# Patient Record
Sex: Male | Born: 1940 | Race: White | Hispanic: No | Marital: Married | Smoking: Former smoker
Health system: Southern US, Community
[De-identification: ages and names within clinical notes are randomized; demographics above are authoritative.]

## PROBLEM LIST (undated history)

## (undated) DIAGNOSIS — K222 Esophageal obstruction: Secondary | ICD-10-CM

## (undated) DIAGNOSIS — R1319 Other dysphagia: Secondary | ICD-10-CM

## (undated) DIAGNOSIS — G4700 Insomnia, unspecified: Secondary | ICD-10-CM

## (undated) DIAGNOSIS — K219 Gastro-esophageal reflux disease without esophagitis: Secondary | ICD-10-CM

## (undated) DIAGNOSIS — N400 Enlarged prostate without lower urinary tract symptoms: Secondary | ICD-10-CM

## (undated) HISTORY — PX: UPPER GI ENDOSCOPY: SHX6162

## (undated) HISTORY — PX: HERNIA REPAIR: SHX51

## (undated) HISTORY — PX: COLONOSCOPY: SHX174

---

## 2009-09-19 ENCOUNTER — Ambulatory Visit: Payer: Self-pay | Admitting: Gastroenterology

## 2012-08-15 ENCOUNTER — Ambulatory Visit: Payer: Self-pay | Admitting: Gastroenterology

## 2016-06-26 ENCOUNTER — Other Ambulatory Visit: Payer: Self-pay | Admitting: Internal Medicine

## 2016-06-26 ENCOUNTER — Ambulatory Visit
Admission: RE | Admit: 2016-06-26 | Discharge: 2016-06-26 | Disposition: A | Discharge status: Home / Self Care | Payer: BLUE CROSS/BLUE SHIELD | Source: Ambulatory Visit | Attending: Internal Medicine | Admitting: Internal Medicine

## 2016-06-26 DIAGNOSIS — I82421 Acute embolism and thrombosis of right iliac vein: Secondary | ICD-10-CM | POA: Insufficient documentation

## 2016-10-27 DIAGNOSIS — C4492 Squamous cell carcinoma of skin, unspecified: Secondary | ICD-10-CM

## 2016-10-27 HISTORY — DX: Squamous cell carcinoma of skin, unspecified: C44.92

## 2017-11-12 DIAGNOSIS — Z125 Encounter for screening for malignant neoplasm of prostate: Secondary | ICD-10-CM | POA: Diagnosis not present

## 2017-11-12 DIAGNOSIS — Z79899 Other long term (current) drug therapy: Secondary | ICD-10-CM | POA: Diagnosis not present

## 2017-11-18 DIAGNOSIS — Z125 Encounter for screening for malignant neoplasm of prostate: Secondary | ICD-10-CM | POA: Diagnosis not present

## 2017-11-18 DIAGNOSIS — Z0000 Encounter for general adult medical examination without abnormal findings: Secondary | ICD-10-CM | POA: Diagnosis not present

## 2017-11-18 DIAGNOSIS — Z79899 Other long term (current) drug therapy: Secondary | ICD-10-CM | POA: Diagnosis not present

## 2018-02-22 DIAGNOSIS — L03115 Cellulitis of right lower limb: Secondary | ICD-10-CM | POA: Diagnosis not present

## 2018-03-30 DIAGNOSIS — J0100 Acute maxillary sinusitis, unspecified: Secondary | ICD-10-CM | POA: Diagnosis not present

## 2018-07-07 DIAGNOSIS — H2513 Age-related nuclear cataract, bilateral: Secondary | ICD-10-CM | POA: Diagnosis not present

## 2018-07-26 DIAGNOSIS — R21 Rash and other nonspecific skin eruption: Secondary | ICD-10-CM | POA: Diagnosis not present

## 2018-07-26 DIAGNOSIS — Z85828 Personal history of other malignant neoplasm of skin: Secondary | ICD-10-CM | POA: Diagnosis not present

## 2018-07-26 DIAGNOSIS — D485 Neoplasm of uncertain behavior of skin: Secondary | ICD-10-CM | POA: Diagnosis not present

## 2018-08-02 DIAGNOSIS — L249 Irritant contact dermatitis, unspecified cause: Secondary | ICD-10-CM | POA: Diagnosis not present

## 2018-11-14 ENCOUNTER — Other Ambulatory Visit: Payer: Self-pay | Admitting: Neurology

## 2018-11-14 DIAGNOSIS — R4189 Other symptoms and signs involving cognitive functions and awareness: Secondary | ICD-10-CM

## 2018-11-30 ENCOUNTER — Other Ambulatory Visit: Payer: Self-pay

## 2018-11-30 ENCOUNTER — Ambulatory Visit
Admission: RE | Admit: 2018-11-30 | Discharge: 2018-11-30 | Disposition: A | Discharge status: Home / Self Care | Payer: Medicare Other | Source: Ambulatory Visit | Attending: Neurology | Admitting: Neurology

## 2018-11-30 DIAGNOSIS — R4189 Other symptoms and signs involving cognitive functions and awareness: Secondary | ICD-10-CM

## 2018-12-01 ENCOUNTER — Other Ambulatory Visit: Payer: Medicare Other

## 2018-12-02 ENCOUNTER — Ambulatory Visit: Payer: Medicare Other

## 2019-09-04 ENCOUNTER — Other Ambulatory Visit: Payer: Self-pay | Admitting: Gastroenterology

## 2019-09-04 DIAGNOSIS — R1310 Dysphagia, unspecified: Secondary | ICD-10-CM

## 2019-09-04 DIAGNOSIS — R1319 Other dysphagia: Secondary | ICD-10-CM

## 2019-09-08 ENCOUNTER — Other Ambulatory Visit: Payer: Self-pay

## 2019-09-08 ENCOUNTER — Other Ambulatory Visit: Payer: Self-pay | Admitting: Gastroenterology

## 2019-09-08 ENCOUNTER — Ambulatory Visit
Admission: RE | Admit: 2019-09-08 | Discharge: 2019-09-08 | Disposition: A | Discharge status: Home / Self Care | Payer: Medicare Other | Source: Ambulatory Visit | Attending: Gastroenterology | Admitting: Gastroenterology

## 2019-09-08 DIAGNOSIS — R1310 Dysphagia, unspecified: Secondary | ICD-10-CM | POA: Diagnosis not present

## 2019-09-08 DIAGNOSIS — R1319 Other dysphagia: Secondary | ICD-10-CM

## 2019-12-12 ENCOUNTER — Other Ambulatory Visit: Payer: Self-pay

## 2019-12-12 ENCOUNTER — Other Ambulatory Visit
Admission: RE | Admit: 2019-12-12 | Discharge: 2019-12-12 | Disposition: A | Discharge status: Home / Self Care | Payer: Medicare Other | Source: Ambulatory Visit | Attending: Internal Medicine | Admitting: Internal Medicine

## 2019-12-12 DIAGNOSIS — Z01812 Encounter for preprocedural laboratory examination: Secondary | ICD-10-CM | POA: Insufficient documentation

## 2019-12-12 DIAGNOSIS — Z20822 Contact with and (suspected) exposure to covid-19: Secondary | ICD-10-CM | POA: Diagnosis not present

## 2019-12-12 LAB — SARS CORONAVIRUS 2 (TAT 6-24 HRS): SARS Coronavirus 2: NEGATIVE

## 2019-12-13 ENCOUNTER — Encounter: Payer: Self-pay | Admitting: Internal Medicine

## 2019-12-14 ENCOUNTER — Ambulatory Visit
Admission: RE | Admit: 2019-12-14 | Discharge: 2019-12-14 | Disposition: A | Discharge status: Home / Self Care | Payer: Medicare Other | Attending: Internal Medicine | Admitting: Internal Medicine

## 2019-12-14 ENCOUNTER — Ambulatory Visit: Payer: Medicare Other | Admitting: Certified Registered Nurse Anesthetist

## 2019-12-14 ENCOUNTER — Encounter
Admission: RE | Disposition: A | Discharge status: Home / Self Care | Payer: Self-pay | Source: Home / Self Care | Attending: Internal Medicine

## 2019-12-14 ENCOUNTER — Encounter: Payer: Self-pay | Admitting: Internal Medicine

## 2019-12-14 ENCOUNTER — Other Ambulatory Visit: Payer: Self-pay

## 2019-12-14 DIAGNOSIS — K2100 Gastro-esophageal reflux disease with esophagitis, without bleeding: Secondary | ICD-10-CM | POA: Diagnosis not present

## 2019-12-14 DIAGNOSIS — Z1211 Encounter for screening for malignant neoplasm of colon: Secondary | ICD-10-CM | POA: Diagnosis not present

## 2019-12-14 DIAGNOSIS — K222 Esophageal obstruction: Secondary | ICD-10-CM | POA: Diagnosis not present

## 2019-12-14 DIAGNOSIS — Z79899 Other long term (current) drug therapy: Secondary | ICD-10-CM | POA: Insufficient documentation

## 2019-12-14 DIAGNOSIS — K640 First degree hemorrhoids: Secondary | ICD-10-CM | POA: Diagnosis not present

## 2019-12-14 DIAGNOSIS — D123 Benign neoplasm of transverse colon: Secondary | ICD-10-CM | POA: Diagnosis not present

## 2019-12-14 DIAGNOSIS — R1310 Dysphagia, unspecified: Secondary | ICD-10-CM | POA: Insufficient documentation

## 2019-12-14 DIAGNOSIS — Z87891 Personal history of nicotine dependence: Secondary | ICD-10-CM | POA: Diagnosis not present

## 2019-12-14 DIAGNOSIS — D122 Benign neoplasm of ascending colon: Secondary | ICD-10-CM | POA: Insufficient documentation

## 2019-12-14 HISTORY — PX: ESOPHAGOGASTRODUODENOSCOPY (EGD) WITH PROPOFOL: SHX5813

## 2019-12-14 HISTORY — DX: Insomnia, unspecified: G47.00

## 2019-12-14 HISTORY — DX: Benign prostatic hyperplasia without lower urinary tract symptoms: N40.0

## 2019-12-14 HISTORY — PX: COLONOSCOPY WITH PROPOFOL: SHX5780

## 2019-12-14 HISTORY — DX: Gastro-esophageal reflux disease without esophagitis: K21.9

## 2019-12-14 HISTORY — DX: Esophageal obstruction: K22.2

## 2019-12-14 HISTORY — DX: Other dysphagia: R13.19

## 2019-12-14 SURGERY — ESOPHAGOGASTRODUODENOSCOPY (EGD) WITH PROPOFOL
Anesthesia: General

## 2019-12-14 MED ORDER — SODIUM CHLORIDE 0.9 % IV SOLN: INTRAVENOUS | Status: DC

## 2019-12-14 MED ORDER — PHENYLEPHRINE HCL (PRESSORS) 10 MG/ML IV SOLN
INTRAVENOUS | Status: AC
  Filled 2019-12-14: qty 1

## 2019-12-14 MED ORDER — PROPOFOL 10 MG/ML IV BOLUS
INTRAVENOUS | Status: DC | PRN
  Administered 2019-12-14: 10 mg via INTRAVENOUS
  Administered 2019-12-14: 20 mg via INTRAVENOUS
  Administered 2019-12-14: 70 mg via INTRAVENOUS

## 2019-12-14 MED ORDER — PROPOFOL 500 MG/50ML IV EMUL
INTRAVENOUS | Status: DC | PRN
  Administered 2019-12-14: 160 ug/kg/min via INTRAVENOUS

## 2019-12-14 MED ORDER — GLYCOPYRROLATE 0.2 MG/ML IJ SOLN
INTRAMUSCULAR | Status: AC
  Filled 2019-12-14: qty 1

## 2019-12-14 MED ORDER — PROPOFOL 500 MG/50ML IV EMUL
INTRAVENOUS | Status: AC
  Filled 2019-12-14: qty 250

## 2019-12-14 MED ORDER — LIDOCAINE HCL (CARDIAC) PF 100 MG/5ML IV SOSY
PREFILLED_SYRINGE | INTRAVENOUS | Status: DC | PRN
  Administered 2019-12-14: 100 mg via INTRAVENOUS

## 2019-12-14 MED ORDER — GLYCOPYRROLATE 0.2 MG/ML IJ SOLN
INTRAMUSCULAR | Status: DC | PRN
  Administered 2019-12-14: .2 mg via INTRAVENOUS

## 2019-12-14 MED ORDER — LIDOCAINE HCL (PF) 2 % IJ SOLN
INTRAMUSCULAR | Status: AC
  Filled 2019-12-14: qty 20

## 2019-12-14 NOTE — Op Note (Signed)
[facility] Gastroenterology Patient Name: Arthur Long Procedure Date: 12/14/2019 7:35 AM MRN: [id number] Account #: [id number] Date of Birth: 1940/11/17 Admit Type: Outpatient Age: 79 Room: [facility] ENDO ROOM 2 Gender: Male Note Status: Finalized Procedure:             Colonoscopy Indications:           Screening for colorectal malignant neoplasm Providers:             [name]. Pepper Wyndham MD, MD Medicines:             Propofol per Anesthesia Complications:         No immediate complications. Procedure:             Pre-Anesthesia Assessment:                        - The risks and benefits of the procedure and the                         sedation options and risks were discussed with the                         patient. All questions were answered and informed                         consent was obtained.                        - Patient identification and proposed procedure were                         verified prior to the procedure by the nurse. The                         procedure was verified in the procedure room.                        - ASA Grade Assessment: III - A patient with severe                         systemic disease.                        - After reviewing the risks and benefits, the patient                         was deemed in satisfactory condition to undergo the                         procedure.                        After obtaining informed consent, the colonoscope was                         passed under direct vision. Throughout the procedure,                         the patient's blood pressure, pulse, and oxygen  saturations were monitored continuously. The                         Colonoscope was introduced through the anus and                         advanced to the the cecum, identified by appendiceal                         orifice and ileocecal valve. The colonoscopy was                         performed without  difficulty. The patient tolerated                         the procedure well. The quality of the bowel                         preparation was adequate. The ileocecal valve,                         appendiceal orifice, and rectum were photographed. Findings:      The perianal and digital rectal examinations were normal. Pertinent       negatives include normal sphincter tone and no palpable rectal lesions.      Multiple small and large-mouthed diverticula were found in the sigmoid       colon.      A 12 mm polyp was found in the transverse colon. The polyp was       pedunculated. The polyp was removed with a hot snare. Resection and       retrieval were complete. To prevent bleeding after the polypectomy, two       hemostatic clips were successfully placed (MR conditional). There was no       bleeding at the end of the procedure.      Two semi-pedunculated polyps were found in the ascending colon. The       polyps were 7 to 9 mm in size. These polyps were removed with a hot       snare. Resection and retrieval were complete.      Non-bleeding internal hemorrhoids were found during retroflexion. The       hemorrhoids were Grade I (internal hemorrhoids that do not prolapse).      The exam was otherwise without abnormality. Impression:            - Diverticulosis in the sigmoid colon.                        - One 12 mm polyp in the transverse colon, removed                         with a hot snare. Resected and retrieved. Clips (MR                         conditional) were placed.                        - Two 7 to 9 mm polyps in the ascending colon, removed  with a hot snare. Resected and retrieved.                        - Non-bleeding internal hemorrhoids.                        - The examination was otherwise normal. Recommendation:        - Await pathology results from EGD, also performed                         today.                        - Monitor results to  esophageal dilation                        - Patient has a contact number available for                         emergencies. The signs and symptoms of potential                         delayed complications were discussed with the patient.                         Return to normal activities tomorrow. Written                         discharge instructions were provided to the patient.                        - Resume previous diet.                        - Continue present medications.                        - Repeat colonoscopy is recommended for surveillance.                         The colonoscopy date will be determined after                         pathology results from today's exam become available                         for review.                        - Return to physician assistant in 3 weeks.                        - No aspirin, ibuprofen, naproxen, or other                         non-steroidal anti-inflammatory drugs for 3 weeks                         after polyp removal.                        -  The findings and recommendations were discussed with                         the patient and their spouse. Procedure Code(s):     --- Professional ---                        [phone], Colonoscopy, flexible; with removal of                         tumor(s), polyp(s), or other lesion(s) by snare                         technique Diagnosis Code(s):     --- Professional ---                        K57.30, Diverticulosis of large intestine without                         perforation or abscess without bleeding                        K64.0, First degree hemorrhoids                        Z12.11, Encounter for screening for malignant neoplasm                         of colon                        K63.5, Polyp of colon CPT copyright 2019 American Medical Association. All rights reserved. The codes documented in this report are preliminary and upon coder review may  be revised to meet  current compliance requirements. Efrain Sella MD, MD 12/14/2019 9:09:14 AM This report has been signed electronically. Number of Addenda: 0 Note Initiated On: 12/14/2019 7:35 AM Scope Withdrawal Time: 0 hours 9 minutes 5 seconds  Total Procedure Duration: 0 hours 17 minutes 52 seconds  Estimated Blood Loss:  Estimated blood loss was minimal.      [facility]

## 2019-12-14 NOTE — Anesthesia Postprocedure Evaluation (Signed)
Anesthesia Post Note  Patient: Arthur Long  Procedure(s) Performed: ESOPHAGOGASTRODUODENOSCOPY (EGD) WITH PROPOFOL (N/A ) COLONOSCOPY WITH PROPOFOL (N/A )  Patient location during evaluation: Endoscopy Anesthesia Type: General Level of consciousness: awake and alert Pain management: pain level controlled Vital Signs Assessment: post-procedure vital signs reviewed and stable Respiratory status: spontaneous breathing, nonlabored ventilation, respiratory function stable and patient connected to nasal cannula oxygen Cardiovascular status: blood pressure returned to baseline and stable Postop Assessment: no apparent nausea or vomiting Anesthetic complications: no   No complications documented.   Last Vitals:  Vitals:   12/14/19 0916 12/14/19 0926  BP: 117/74 105/76  Pulse: 94 87  Resp: 19 16  Temp:    SpO2: 97% 99%    Last Pain:  Vitals:   12/14/19 0926  TempSrc:   PainSc: 0-No pain                 Arita Miss

## 2019-12-14 NOTE — Transfer of Care (Signed)
Immediate Anesthesia Transfer of Care Note  Patient: Arthur Long  Procedure(s) Performed: ESOPHAGOGASTRODUODENOSCOPY (EGD) WITH PROPOFOL (N/A ) COLONOSCOPY WITH PROPOFOL (N/A )  Patient Location: PACU  Anesthesia Type:General  Level of Consciousness: drowsy  Airway & Oxygen Therapy: Patient Spontanous Breathing and Patient connected to nasal cannula oxygen  Post-op Assessment: Report given to RN and Post -op Vital signs reviewed and stable  Post vital signs: Reviewed and stable  Last Vitals:  Vitals Value Taken Time  BP 101/65 12/14/19 0906  Temp    Pulse 87 12/14/19 0907  Resp 17 12/14/19 0907  SpO2 96 % 12/14/19 0907  Vitals shown include unvalidated device data.  Last Pain:  Vitals:   12/14/19 0906  TempSrc:   PainSc: 0-No pain         Complications: No complications documented.

## 2019-12-14 NOTE — Anesthesia Preprocedure Evaluation (Signed)
Anesthesia Evaluation  Patient identified by MRN, date of birth, ID band Patient awake    Reviewed: Allergy & Precautions, NPO status , Patient's Chart, lab work & pertinent test results  History of Anesthesia Complications Negative for: history of anesthetic complications  Airway Mallampati: II  TM Distance: >3 FB Neck ROM: Full    Dental no notable dental hx. (+) Teeth Intact   Pulmonary neg pulmonary ROS, neg sleep apnea, neg COPD, Patient abstained from smoking.Not current smoker, former smoker,    Pulmonary exam normal breath sounds clear to auscultation       Cardiovascular Exercise Tolerance: Good METS(-) hypertension(-) CAD and (-) Past MI negative cardio ROS  (-) dysrhythmias  Rhythm:Regular Rate:Normal - Systolic murmurs    Neuro/Psych negative neurological ROS  negative psych ROS   GI/Hepatic GERD  ,(+)     (-) substance abuse  ,   Endo/Other  neg diabetes  Renal/GU negative Renal ROS     Musculoskeletal   Abdominal   Peds  Hematology   Anesthesia Other Findings Past Medical History: No date: BPH (benign prostatic hyperplasia) No date: Esophageal dysphagia No date: Esophageal stricture No date: GERD (gastroesophageal reflux disease) No date: Insomnia  Reproductive/Obstetrics                            Anesthesia Physical Anesthesia Plan  ASA: II  Anesthesia Plan: General   Post-op Pain Management:    Induction: Intravenous  PONV Risk Score and Plan: 2 and Ondansetron, Propofol infusion and TIVA  Airway Management Planned: Nasal Cannula  Additional Equipment: None  Intra-op Plan:   Post-operative Plan:   Informed Consent: I have reviewed the patients History and Physical, chart, labs and discussed the procedure including the risks, benefits and alternatives for the proposed anesthesia with the patient or authorized representative who has indicated his/her  understanding and acceptance.     Dental advisory given  Plan Discussed with: CRNA and Surgeon  Anesthesia Plan Comments: (Discussed risks of anesthesia with patient, including possibility of difficulty with spontaneous ventilation under anesthesia necessitating airway intervention, PONV, and rare risks such as cardiac or respiratory or neurological events. Patient understands.)        Anesthesia Quick Evaluation

## 2019-12-14 NOTE — Op Note (Signed)
[facility] Gastroenterology Patient Name: Arthur Long Procedure Date: 12/14/2019 7:40 AM MRN: [id number] Account #: [id number] Date of Birth: 07/16/1940 Admit Type: Outpatient Age: 79 Room: [facility] ENDO ROOM 2 Gender: Male Note Status: Finalized Procedure:             Upper GI endoscopy Indications:           Dysphagia, Gastro-esophageal reflux disease Providers:             [name]. [name] MD, MD Referring MD:          Rusty Aus, MD (Referring MD) Medicines:             Propofol per Anesthesia Complications:         No immediate complications. Procedure:             Pre-Anesthesia Assessment:                        - The risks and benefits of the procedure and the                         sedation options and risks were discussed with the                         patient. All questions were answered and informed                         consent was obtained.                        - Patient identification and proposed procedure were                         verified prior to the procedure by the nurse. The                         procedure was verified in the procedure room.                        - ASA Grade Assessment: III - A patient with severe                         systemic disease.                        - After reviewing the risks and benefits, the patient                         was deemed in satisfactory condition to undergo the                         procedure.                        After obtaining informed consent, the endoscope was                         passed under direct vision. Throughout the procedure,                         the patient's blood  pressure, pulse, and oxygen                         saturations were monitored continuously. The Endoscope                         was introduced through the mouth, and advanced to the                         third part of duodenum. The upper GI endoscopy was                         accomplished  without difficulty. The patient tolerated                         the procedure well. Findings:      Diffuse mild mucosal changes characterized by feline appearance were       found in the entire esophagus. Biopsies were obtained from the proximal       and distal esophagus with cold forceps for histology of suspected       eosinophilic esophagitis.      One benign-appearing, intrinsic moderate stenosis was found in the       distal esophagus. This stenosis measured 1.3 cm (inner diameter) x less       than one cm (in length). The stenosis was traversed. The scope was       withdrawn. Dilation was performed with a Maloney dilator with mild       resistance at 46 Fr.      The stomach was normal.      The examined duodenum was normal.      LA Grade C (one or more mucosal breaks continuous between tops of 2 or       more mucosal folds, less than 75% circumference) esophagitis with no       bleeding was found in the distal esophagus. Impression:            - Feline appearance mucosa in the esophagus. Biopsied.                        - Benign-appearing esophageal stenosis. Dilated.                        - Normal stomach.                        - Normal examined duodenum. Recommendation:        - Await pathology results.                        - Proceed with colonoscopy Procedure Code(s):     --- Professional ---                        [phone], Esophagogastroduodenoscopy, flexible,                         transoral; with biopsy, single or multiple                        [id number], Dilation of esophagus, by unguided sound or  bougie, single or multiple passes Diagnosis Code(s):     --- Professional ---                        K21.9, Gastro-esophageal reflux disease without                         esophagitis                        R13.10, Dysphagia, unspecified                        K22.2, Esophageal obstruction CPT copyright 2019 American Medical Association. All rights  reserved. The codes documented in this report are preliminary and upon coder review may  be revised to meet current compliance requirements. Efrain Sella MD, MD 12/14/2019 8:43:19 AM This report has been signed electronically. Number of Addenda: 0 Note Initiated On: 12/14/2019 7:40 AM Estimated Blood Loss:  Estimated blood loss: none. Estimated blood loss: none.      [facility]

## 2019-12-14 NOTE — H&P (Signed)
Outpatient short stay form Pre-procedure 12/14/2019 8:29 AM Arthur Long Arthur Long, M.D.  Primary Physician: Arthur Long, M.D.  Reason for visit:  Dysphagia, GERD, colon cancer screening  History of present illness:  79 y/o male with hx of memory disturbance c/o dysphagia to solids like chicken to the level of the suprasternal notch. No hemetemesis or significant weight loss or abdominal pain. Appetite normal.  Patient presents for colonoscopy for colon cancer screening. The patient denies complaints of abdominal pain, significant change in bowel habits, or rectal bleeding.      Current Facility-Administered Medications:  .  0.9 %  sodium chloride infusion, , Intravenous, Continuous, [place], Arthur Pike, MD, Last Rate: 20 mL/hr at 12/14/19 8469, Continued from Pre-op at 12/14/19 6295  Medications Prior to Admission  Medication Sig Dispense Refill Last Dose  . Alum & Mag Hydroxide-Simeth (MYLANTA PO) Take by mouth.     . pantoprazole (PROTONIX) 40 MG tablet Take 40 mg by mouth daily.   12/13/2019 at Unknown time  . rivastigmine (EXELON) 3 MG capsule Take 3 mg by mouth 2 (two) times daily.     . Saw Palmetto, Serenoa repens, (SAW PALMETTO PO) Take by mouth 2 (two) times daily.     . temazepam (RESTORIL) 30 MG capsule Take 30 mg by mouth at bedtime as needed for sleep.   12/13/2019 at Unknown time     Not on File   Past Medical History:  Diagnosis Date  . BPH (benign prostatic hyperplasia)   . Esophageal dysphagia   . Esophageal stricture   . GERD (gastroesophageal reflux disease)   . Insomnia     Review of systems:  Otherwise negative.    Physical Exam  Gen: Alert, oriented. Appears stated age.  HEENT: [place]/AT. PERRLA. Lungs: CTA, no wheezes. CV: RR nl S1, S2. Abd: soft, benign, no masses. BS+ Ext: No edema. Pulses 2+    Planned procedures: Proceed with EGD and colonoscopy. The patient understands the nature of the planned procedure, indications, risks, alternatives and  potential complications including but not limited to bleeding, infection, perforation, damage to internal organs and possible oversedation/side effects from anesthesia. The patient agrees and gives consent to proceed.  Please refer to procedure notes for findings, recommendations and patient disposition/instructions.     Arthur Long Arthur Long, M.D. Gastroenterology 12/14/2019  8:29 AM

## 2019-12-14 NOTE — Interval H&P Note (Signed)
History and Physical Interval Note:  12/14/2019 8:30 AM  Arthur Long  has presented today for surgery, with the diagnosis of [place].  The various methods of treatment have been discussed with the patient and family. After consideration of risks, benefits and other options for treatment, the patient has consented to  Procedure(s): ESOPHAGOGASTRODUODENOSCOPY (EGD) WITH PROPOFOL (N/A) COLONOSCOPY WITH PROPOFOL (N/A) as a surgical intervention.  The patient's history has been reviewed, patient examined, no change in status, stable for surgery.  I have reviewed the patient's chart and labs.  Questions were answered to the patient's satisfaction.     [place], [place]

## 2019-12-15 ENCOUNTER — Encounter: Payer: Self-pay | Admitting: Internal Medicine

## 2019-12-15 LAB — SURGICAL PATHOLOGY

## 2019-12-27 ENCOUNTER — Ambulatory Visit: Payer: Medicare Other | Admitting: Dermatology

## 2019-12-27 ENCOUNTER — Encounter: Payer: Self-pay | Admitting: Dermatology

## 2019-12-27 ENCOUNTER — Other Ambulatory Visit: Payer: Self-pay

## 2019-12-27 DIAGNOSIS — L739 Follicular disorder, unspecified: Secondary | ICD-10-CM

## 2019-12-27 DIAGNOSIS — L578 Other skin changes due to chronic exposure to nonionizing radiation: Secondary | ICD-10-CM

## 2019-12-27 DIAGNOSIS — Z1283 Encounter for screening for malignant neoplasm of skin: Secondary | ICD-10-CM | POA: Diagnosis not present

## 2019-12-27 DIAGNOSIS — L820 Inflamed seborrheic keratosis: Secondary | ICD-10-CM

## 2019-12-27 DIAGNOSIS — L814 Other melanin hyperpigmentation: Secondary | ICD-10-CM

## 2019-12-27 DIAGNOSIS — D229 Melanocytic nevi, unspecified: Secondary | ICD-10-CM

## 2019-12-27 DIAGNOSIS — L821 Other seborrheic keratosis: Secondary | ICD-10-CM | POA: Diagnosis not present

## 2019-12-27 DIAGNOSIS — Z86007 Personal history of in-situ neoplasm of skin: Secondary | ICD-10-CM

## 2019-12-27 NOTE — Patient Instructions (Addendum)
Scalpicin solution Extra Strength 2-3 times daily to affected areas on scalp as needed. Use Head and Shoulders shampoo daily to scalp, let sit couple minutes prior to rinsing.   Recommend daily broad spectrum sunscreen SPF 30+ to sun-exposed areas, reapply every 2 hours as needed. Call for new or changing lesions.  .Seborrheic Keratosis  What causes seborrheic keratoses? Seborrheic keratoses are harmless, common skin growths that first appear during adult life.  As time goes by, more growths appear.  Some people may develop a large number of them.  Seborrheic keratoses appear on both covered and uncovered body parts.  They are not caused by sunlight.  The tendency to develop seborrheic keratoses can be inherited.  They vary in color from skin-colored to gray, brown, or even black.  They can be either smooth or have a rough, warty surface.   Seborrheic keratoses are superficial and look as if they were stuck on the skin.  Under the microscope this type of keratosis looks like layers upon layers of skin.  That is why at times the top layer may seem to fall off, but the rest of the growth remains and re-grows.    Treatment Seborrheic keratoses do not need to be treated, but can easily be removed in the office.  Seborrheic keratoses often cause symptoms when they rub on clothing or jewelry.  Lesions can be in the way of shaving.  If they become inflamed, they can cause itching, soreness, or burning.  Removal of a seborrheic keratosis can be accomplished by freezing, burning, or surgery. If any spot bleeds, scabs, or grows rapidly, please return to have it checked, as these can be an indication of a skin cancer.  Wound Care Instructions  1. Cleanse wound gently with soap and water once a day then pat dry with clean gauze. Apply a thing coat of Petrolatum (petroleum jelly, "Vaseline") over the wound (unless you have an allergy to this). We recommend that you use a new, sterile tube of Vaseline. Do not pick  or remove scabs. Do not remove the yellow or white "healing tissue" from the base of the wound.  2. Cover the wound with fresh, clean, nonstick gauze and secure with paper tape. You may use Band-Aids in place of gauze and tape if the would is small enough, but would recommend trimming much of the tape off as there is often too much. Sometimes Band-Aids can irritate the skin.  3. You should call the office for your biopsy report after 1 week if you have not already been contacted.  4. If you experience any problems, such as abnormal amounts of bleeding, swelling, significant bruising, significant pain, or evidence of infection, please call the office immediately.  5. FOR ADULT SURGERY PATIENTS: If you need something for pain relief you may take 1 extra strength Tylenol (acetaminophen) AND 2 Ibuprofen (200mg  each) together every 4 hours as needed for pain. (do not take these if you are allergic to them or if you have a reason you should not take them.) Typically, you may only need pain medication for 1 to 3 days.

## 2019-12-27 NOTE — Progress Notes (Signed)
   Follow-Up Visit   Subjective  Arthur Long is a 79 y.o. male who presents for the following: Skin Problem (face, rough texture after sunburn 2 months ago).  Had blistering sunburn on cheeks.  Has other tender bumps in scalp he would like checked.  Also has other spots to check on back and arms.  Worried about skin cancer.    The following portions of the chart were reviewed this encounter and updated as appropriate:     Review of Systems: No other skin or systemic complaints except as noted in HPI or Assessment and Plan.  Objective  Well appearing patient in no apparent distress; mood and affect are within normal limits.  All skin waist up examined.  Objective  Right and left temporal scalp, right vertex scalp: Folliculitis; indistinct pink papule  Objective  Head - Anterior (Face), torso, arms: Stuck-on, waxy, tan-brown papules and plaques -- Discussed benign etiology and prognosis.   Objective  Left Zygomatic Area x1: Keratotic tan macule  Objective  Right Index Finger: Well healed scar with no evidence of recurrence.   Assessment & Plan  Folliculitis Right and left temporal scalp, right vertex scalp  Recommend using OTC Scalpicin 2-3x per day prn. Recommend using H&S shampoo as directed.   Seborrheic keratosis Head - Anterior (Face), torso, arms  Reassured benign age-related growth.  Recommend observation.  Discussed cryotherapy if spot(s) become irritated or inflamed.   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 Observe for recurrence. Call clinic for new or changing lesions.  Recommend regular skin exams, daily broad-spectrum spf 30+ sunscreen use, and photoprotection.     Hemangiomas - Red papules - Discussed benign nature - Observe - Call for any changes  Lentigines - Scattered tan macules - Discussed due to sun exposure - Benign, observe - Call for any changes  Seborrheic Keratoses - Stuck-on, waxy, tan-brown papules and plaques  - Discussed benign etiology and prognosis. - Observe - Call for any changes  Melanocytic Nevi - Tan-brown and/or pink-flesh-colored symmetric macules and papules - Benign appearing on exam today - Observation - Call clinic for new or changing moles - Recommend daily use of broad spectrum spf 30+ sunscreen to sun-exposed areas.    Actinic Damage - diffuse scaly erythematous macules with underlying dyspigmentation - Recommend daily broad spectrum sunscreen SPF 30+ to sun-exposed areas, reapply every 2 hours as needed.  - Call for new or changing lesions.   Skin cancer screening performed today.   Return in about 1 year (around 12/26/2020) for Upper body exam.   I, Emelia Salisbury, CMA, am acting as scribe for Brendolyn Patty, MD.  Documentation: I have reviewed the above documentation for accuracy and completeness, and I agree with the above.  Brendolyn Patty MD

## 2021-01-06 IMAGING — RF DG ESOPHAGUS
12 of 18 series · 14 of 24 positions shown · non-contrast
Comparison: No recent.

CLINICAL DATA: Dysphagia.

EXAM:
ESOPHOGRAM / BARIUM SWALLOW / BARIUM TABLET STUDY
TECHNIQUE: Combined double contrast and single contrast examination performed
using effervescent crystals, thick barium liquid, and thin barium
liquid. The patient was observed with fluoroscopy swallowing a 13 mm
barium sulphate tablet.
FLUOROSCOPY TIME:  Fluoroscopy Time:  2 minutes 24 seconds
Radiation Exposure Index (if provided by the fluoroscopic device):
51.2 mGy

[Series 1: fluoro_barium 2fps_bw · 0.18mm/px · 2 of 7 frames shown (1 of 12)]
[frame 2/7]
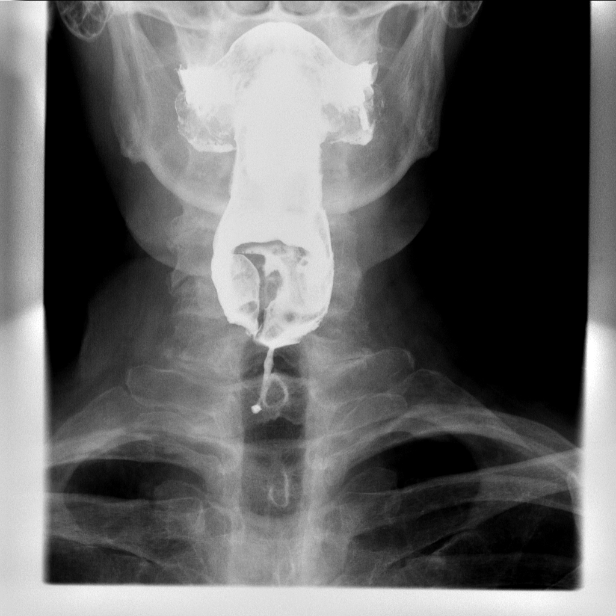
[frame 6/7]
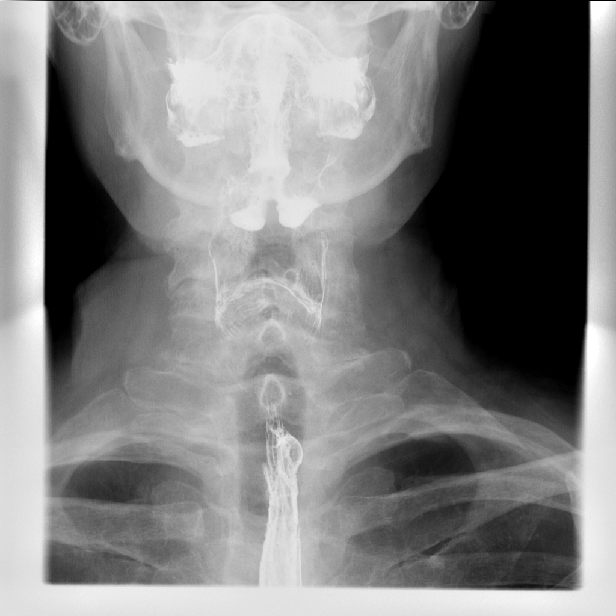

[Series 2: fluoro_barium 2fps_bw · 0.18mm/px · 1 of 9 frames shown (2 of 12)]
[frame 7/9]
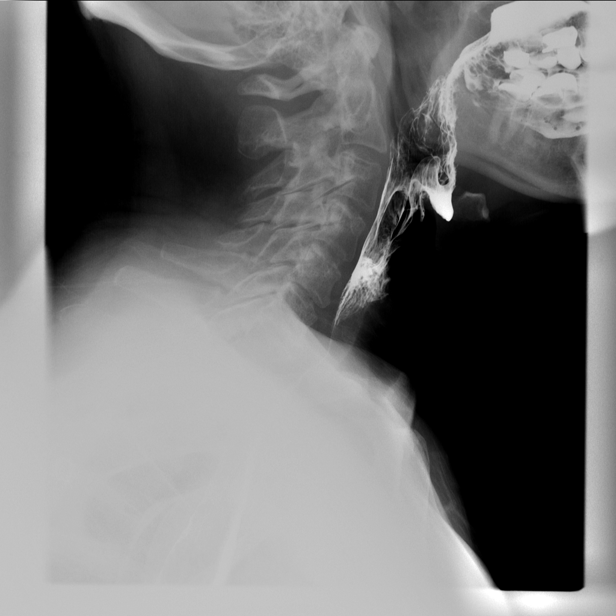

[Series 3: fluoro_barium 2fps_bw · 0.18mm/px · 2 of 2 frames shown (3 of 12)]
[frame 1/2]
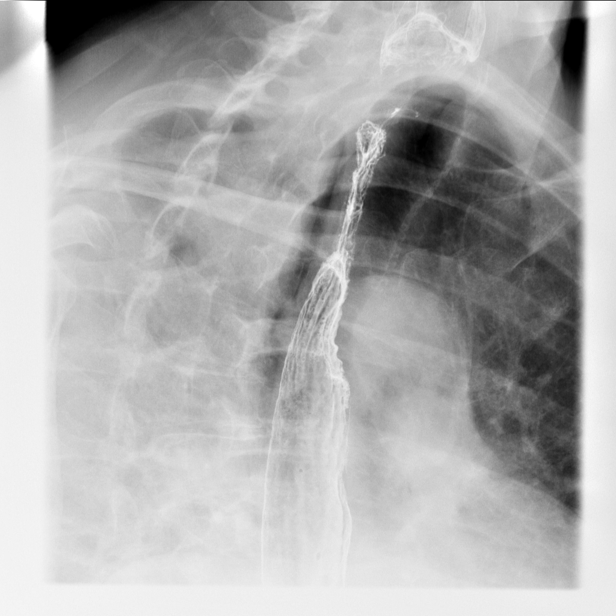
[frame 2/2]
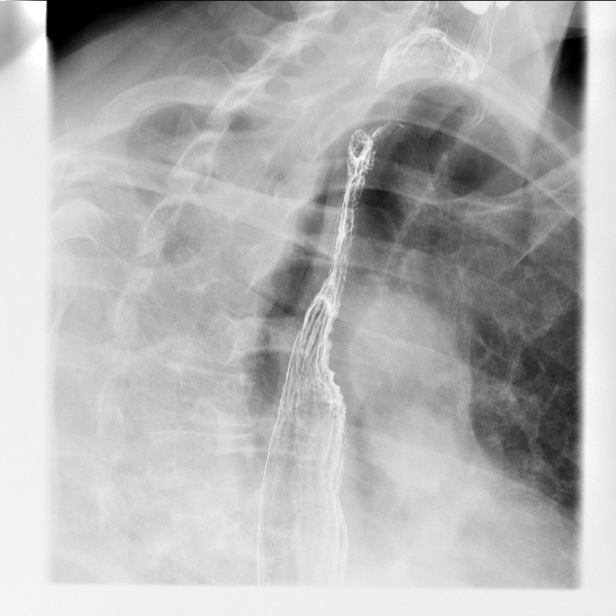

[Series 6: fluoro_barium 2fps_bw · 0.18mm/px · 1 of 1 slices shown (4 of 12)]
[im 1/1]
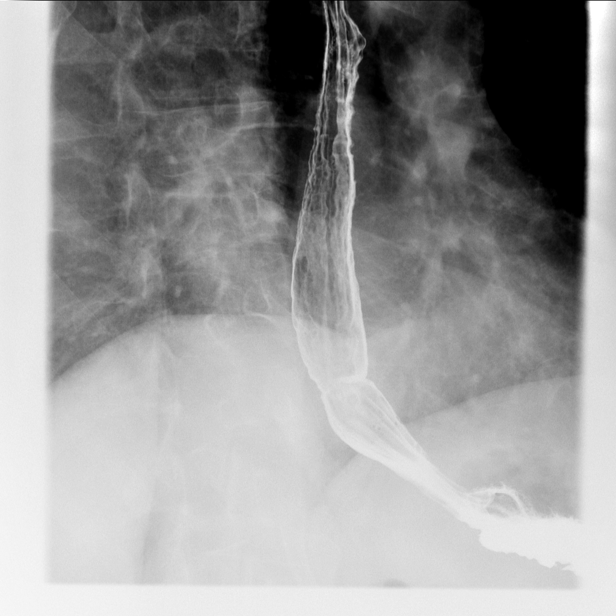

[Series 8: fluoro_barium 2fps_bw · 0.18mm/px · 1 of 1 slices shown (5 of 12)]
[im 1/1]
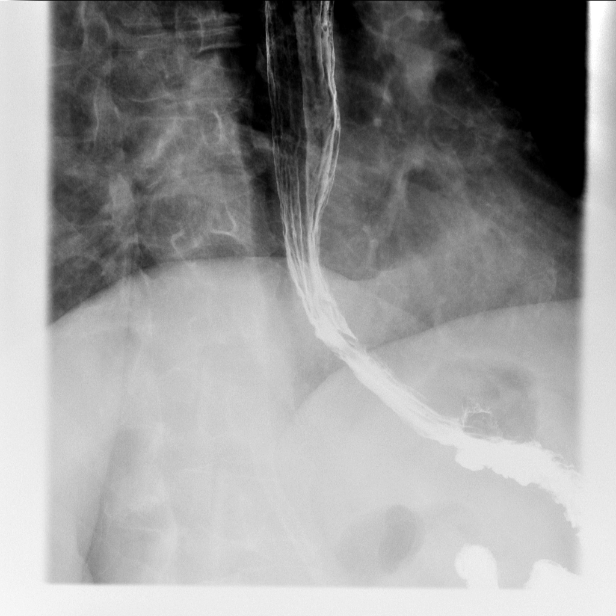

[Series 9: fluoro_barium 2fps_bw · 0.18mm/px · 1 of 1 slices shown (6 of 12)]
[im 1/1]
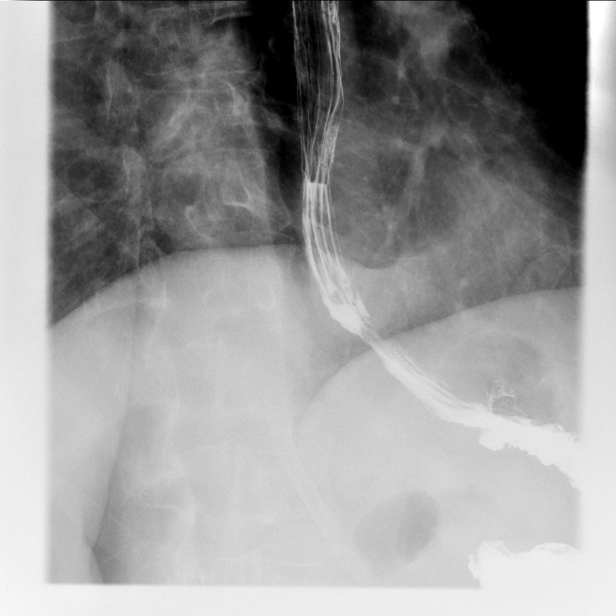

[Series 10: fluoro_barium 2fps_bw · 0.18mm/px · 1 of 2 frames shown (7 of 12)]
[frame 2/2]
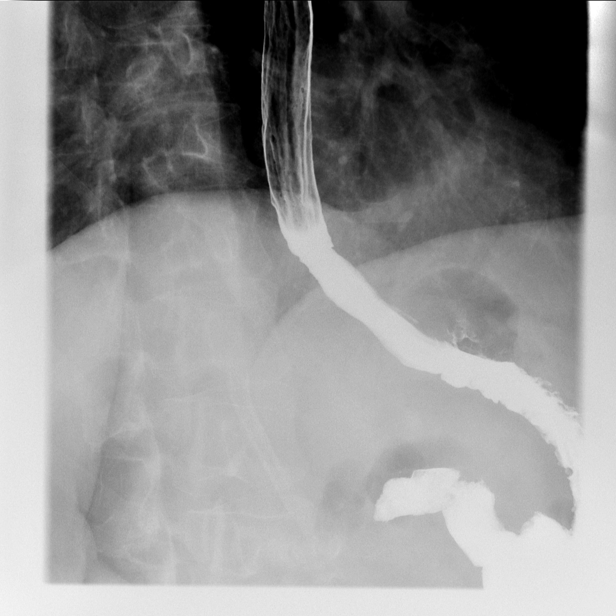

[Series 13: fluoro_barium 2fps_bw · 0.18mm/px · 1 of 1 slices shown (8 of 12)]
[im 1/1]
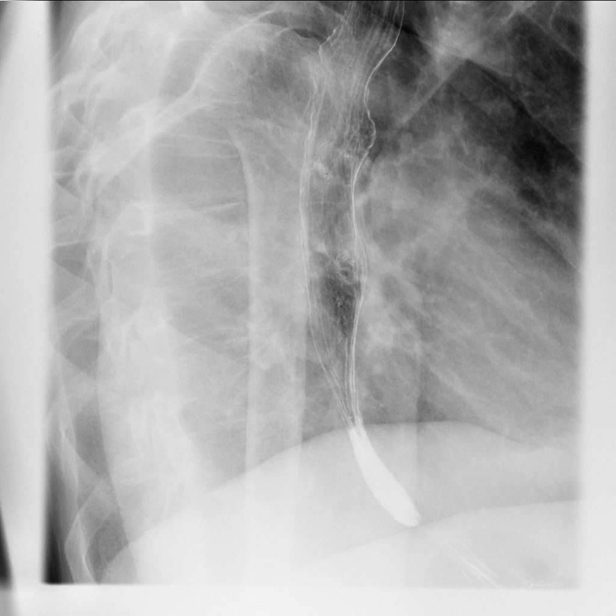

[Series 15: fluoro_barium 2fps_bw · 0.18mm/px · 1 of 1 slices shown (9 of 12)]
[im 1/1]
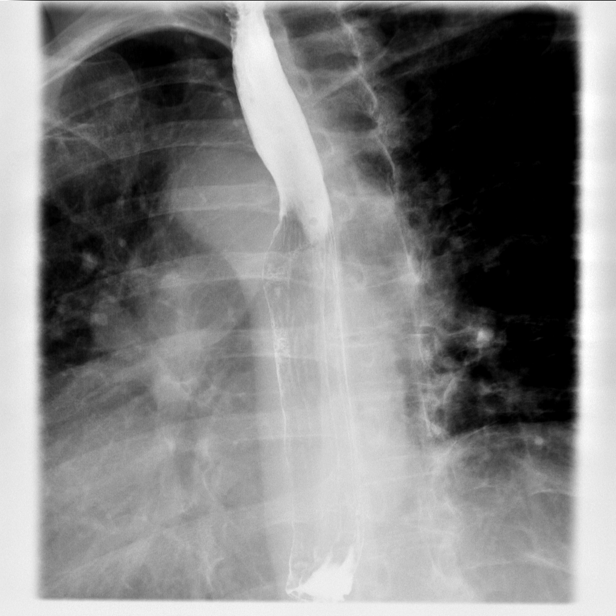

[Series 16: fluoro_barium 2fps_bw · 0.18mm/px · 1 of 1 slices shown (10 of 12)]
[im 1/1]
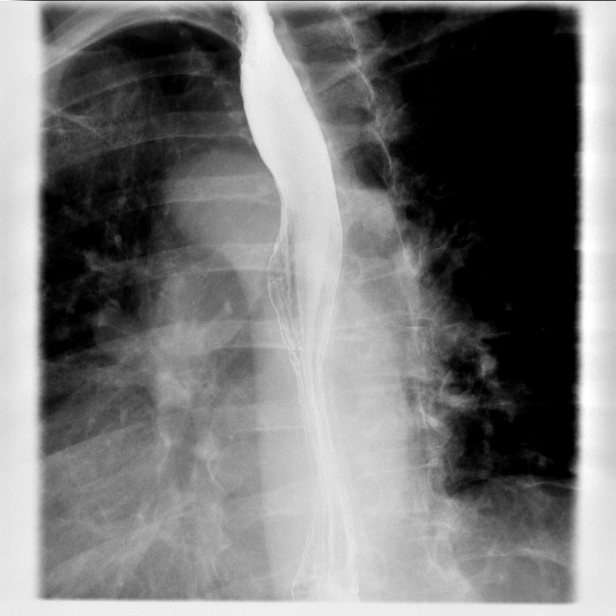

[Series 19: fluoro_barium 2fps_bw · 0.18mm/px · 1 of 1 slices shown (11 of 12)]
[im 1/1]
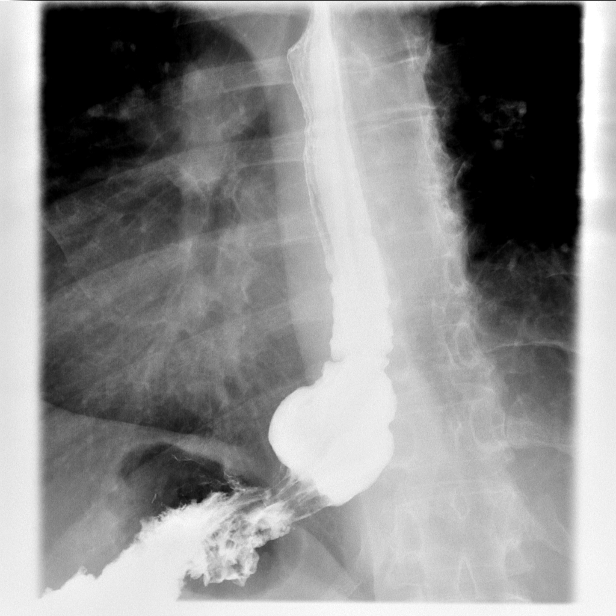

[Series 21: fluoro_barium 2fps_bw · 0.18mm/px · 1 of 1 slices shown (12 of 12)]
[im 1/1]
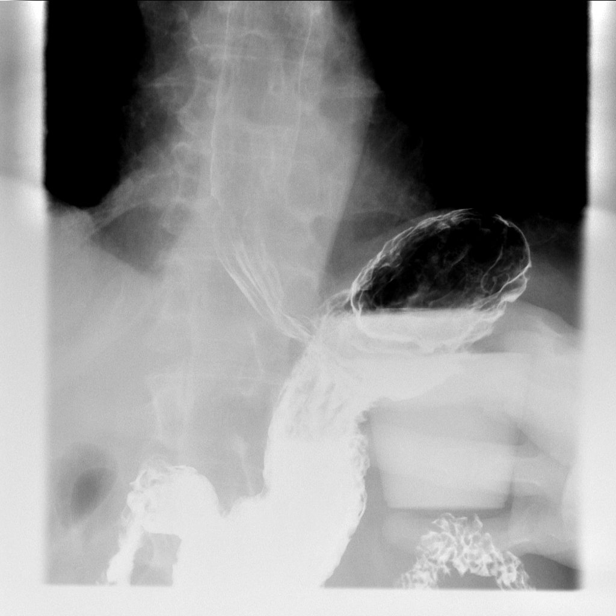

[14 of 24 positions shown; findings below may reference images not displayed]

FINDINGS: Cervical esophagus appears normal. No aspiration. Subtle tiny
ulcerations throughout the thoracic esophagus cannot be excluded.
Esophagitis could present in this fashion. Very tiny esophageal
plaques as can be seen with candidiasis cannot be completely
excluded. Small sliding hiatal hernia. Esophageal B ring noted. This
does not result in delay in passage of a standard barium tablet. No
esophageal obstruction or significant strictures noted. Small hiatal
hernia. No reflux noted.
IMPRESSION: 1. Subtle tiny ulcerations throughout the upper thoracic esophagus
cannot be excluded. Esophagitis could present in this fashion.

2. Very tiny esophageal plaques as can be seen with candidiasis
cannot be completely excluded.

3. Small sliding hiatal hernia. Slightly prominent esophageal B ring
noted. Esophageal B ring does not result in delay in passage of
standard barium tablet. No esophageal obstructions or prominent
stricture is noted.

3.  Small hiatal hernia.  No reflux.

Endoscopic evaluation of this patient may prove useful.

## 2021-01-23 LAB — SURGICAL PATHOLOGY

## 2021-02-05 ENCOUNTER — Ambulatory Visit: Payer: Medicare Other | Admitting: Dermatology

## 2021-04-21 ENCOUNTER — Other Ambulatory Visit: Payer: Self-pay

## 2021-04-21 ENCOUNTER — Ambulatory Visit: Payer: Medicare Other | Admitting: Dermatology

## 2021-04-21 DIAGNOSIS — L738 Other specified follicular disorders: Secondary | ICD-10-CM | POA: Diagnosis not present

## 2021-04-21 DIAGNOSIS — L814 Other melanin hyperpigmentation: Secondary | ICD-10-CM

## 2021-04-21 DIAGNOSIS — D1800 Hemangioma unspecified site: Secondary | ICD-10-CM

## 2021-04-21 DIAGNOSIS — L578 Other skin changes due to chronic exposure to nonionizing radiation: Secondary | ICD-10-CM

## 2021-04-21 DIAGNOSIS — L820 Inflamed seborrheic keratosis: Secondary | ICD-10-CM

## 2021-04-21 DIAGNOSIS — Z86007 Personal history of in-situ neoplasm of skin: Secondary | ICD-10-CM

## 2021-04-21 DIAGNOSIS — L821 Other seborrheic keratosis: Secondary | ICD-10-CM

## 2021-04-21 DIAGNOSIS — Z1283 Encounter for screening for malignant neoplasm of skin: Secondary | ICD-10-CM | POA: Diagnosis not present

## 2021-04-21 DIAGNOSIS — D229 Melanocytic nevi, unspecified: Secondary | ICD-10-CM

## 2021-04-21 NOTE — Progress Notes (Signed)
   Follow-Up Visit   Subjective  Arthur Long is a 80 y.o. male who presents for the following: UBSE (Patient here for upper body skin exam and skin cancer screening. Patient would like spots at face checked that seem to have gotten more red and raised since summer. They are scaly and he picks at them. 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 ).   The following portions of the chart were reviewed this encounter and updated as appropriate:       Review of Systems:  No other skin or systemic complaints except as noted in HPI or Assessment and Plan.  Objective  Well appearing patient in no apparent distress; mood and affect are within normal limits.  All skin waist up examined.  right malar cheek x 1, left malar cheek x 1 (2) Erythematous keratotic or waxy stuck-on papule  face Small yellow papules with a central dell.    Assessment & Plan  Inflamed seborrheic keratosis right malar cheek x 1, left malar cheek x 1  Destruction of lesion - right malar cheek x 1, left malar cheek x 1  Destruction method: cryotherapy   Informed consent: discussed and consent obtained   Lesion destroyed using liquid nitrogen: Yes   Region frozen until ice ball extended beyond lesion: Yes   Outcome: patient tolerated procedure well with no complications   Post-procedure details: wound care instructions given   Additional details:  Prior to procedure, discussed risks of blister formation, small wound, skin dyspigmentation, or rare scar following cryotherapy. Recommend Vaseline ointment to treated areas while healing.   Sebaceous hyperplasia face  Benign, observe.     Lentigines - Scattered tan macules - Due to sun exposure - Benign-appearing, observe - Recommend daily broad spectrum sunscreen SPF 30+ to sun-exposed areas, reapply every 2 hours as needed. - Call for any changes  Seborrheic Keratoses - Stuck-on, waxy, tan-brown papules and/or plaques  - Benign-appearing -  Discussed benign etiology and prognosis. - Observe - Call for any changes  Melanocytic Nevi - Tan-brown and/or pink-flesh-colored symmetric macules and papules - Benign appearing on exam today - Observation - Call clinic for new or changing moles - Recommend daily use of broad spectrum spf 30+ sunscreen to sun-exposed areas.   Hemangiomas - Red papules - Discussed benign nature - Observe - Call for any changes  Actinic Damage - Chronic condition, secondary to cumulative UV/sun exposure - diffuse scaly erythematous macules with underlying dyspigmentation - Recommend daily broad spectrum sunscreen SPF 30+ to sun-exposed areas, reapply every 2 hours as needed.  - Staying in the shade or wearing long sleeves, sun glasses (UVA+UVB protection) and wide brim hats (4-inch brim around the entire circumference of the hat) are also recommended for sun protection.  - Call for new or changing lesions.  Skin cancer screening performed today.  History of Squamous Cell Carcinoma in Situ of the Skin - No evidence of recurrence today at right index finger - Recommend regular full body skin exams - Recommend daily broad spectrum sunscreen SPF 30+ to sun-exposed areas, reapply every 2 hours as needed.  - Call if any new or changing lesions are noted between office visits   Return if symptoms worsen or fail to improve.  Graciella Belton, RMA, am acting as scribe for Brendolyn Patty, MD .  Documentation: I have reviewed the above documentation for accuracy and completeness, and I agree with the above.  Brendolyn Patty MD

## 2021-04-21 NOTE — Patient Instructions (Addendum)
Cryotherapy Aftercare  Wash gently with soap and water everyday.   Apply Vaseline and Band-Aid daily until healed.    Melanoma ABCDEs  Melanoma is the most dangerous type of skin cancer, and is the leading cause of death from skin disease.  You are more likely to develop melanoma if you: Have light-colored skin, light-colored eyes, or red or blond hair Spend a lot of time in the sun Tan regularly, either outdoors or in a tanning bed Have had blistering sunburns, especially during childhood Have a close family member who has had a melanoma Have atypical moles or large birthmarks  Early detection of melanoma is key since treatment is typically straightforward and cure rates are extremely high if we catch it early.   The first sign of melanoma is often a change in a mole or a new dark spot.  The ABCDE system is a way of remembering the signs of melanoma.  A for asymmetry:  The two halves do not match. B for border:  The edges of the growth are irregular. C for color:  A mixture of colors are present instead of an even brown color. D for diameter:  Melanomas are usually (but not always) greater than 72m - the size of a pencil eraser. E for evolution:  The spot keeps changing in size, shape, and color.  Please check your skin once per month between visits. You can use a small mirror in front and a large mirror behind you to keep an eye on the back side or your body.   If you see any new or changing lesions before your next follow-up, please call to schedule a visit.  Please continue daily skin protection including broad spectrum sunscreen SPF 30+ to sun-exposed areas, reapplying every 2 hours as needed when you're outdoors.    If You Need Anything After Your Visit  If you have any questions or concerns for your doctor, please call our main line at [phone] press option 4 to reach your doctor's medical assistant. If no one answers, please leave a voicemail as directed and we will  return your call as soon as possible. Messages left after 4 pm will be answered the following business day.   You may also send [place] message via [place] We typically respond to MyChart messages within 1-2 business days.  For prescription refills, please ask your pharmacy to contact our office. Our fax number is [phone]  If you have an urgent issue when the clinic is closed that cannot wait until the next business day, you can page your doctor at the number below.    Please note that while we do our best to be available for urgent issues outside of office hours, we are not available 24/7.   If you have an urgent issue and are unable to reach [place] you may choose to seek medical care at your doctor's office, retail clinic, urgent care center, or emergency room.  If you have a medical emergency, please immediately call 911 or go to the emergency department.  Pager Numbers  - Dr. KNehemiah Massed [phone] - Dr. MLaurence Ferrari [phone] - Dr. SNicole Kindred [phone] In the event of inclement weather, please call our main line at [phone] an update on the status of any delays or closures.  Dermatology Medication Tips: Please keep the boxes that topical medications come in in order to help keep track of the instructions about where and how to use these. Pharmacies typically print the medication  instructions only on the boxes and not directly on the medication tubes.   If your medication is too expensive, please contact our office at [phone] option 4 or send [place] a message through [place].   We are unable to tell what your co-pay for medications will be in advance as this is different depending on your insurance coverage. However, we may be able to find a substitute medication at lower cost or fill out paperwork to get insurance to cover a needed medication.   If a prior authorization is required to get your medication covered by your insurance company, please allow [place] 1-2 business  days to complete this process.  Drug prices often vary depending on where the prescription is filled and some pharmacies may offer cheaper prices.  The website www.goodrx.com contains coupons for medications through different pharmacies. The prices here do not account for what the cost may be with help from insurance (it may be cheaper with your insurance), but the website can give you the price if you did not use any insurance.  - You can print the associated coupon and take it with your prescription to the pharmacy.  - You may also stop by our office during regular business hours and pick up a GoodRx coupon card.  - If you need your prescription sent electronically to a different pharmacy, notify our office through [facility] or by phone at [phone] option 4.     Si Usted Necesita Algo Despus de Su Visita  Tambin puede enviarnos un mensaje a travs de Pharmacist, community. Por lo general respondemos a los mensajes de MyChart en el transcurso de 1 a 2 das hbiles.  Para renovar recetas, por favor pida a su farmacia que se ponga en contacto con nuestra oficina. Harland Dingwall de fax es [place] [phone].  Si tiene un asunto urgente cuando la clnica est cerrada y que no puede esperar hasta el siguiente da hbil, puede llamar/localizar a su doctor(a) al nmero que aparece a continuacin.   Por favor, tenga en cuenta que aunque hacemos todo lo posible para estar disponibles para asuntos urgentes fuera del horario de [place], no estamos disponibles las 24 horas del da, los 7 das de la [place].   Si tiene un problema urgente y no puede comunicarse con nosotros, puede optar por buscar atencin mdica  en el consultorio de su doctor(a), en una clnica privada, en un centro de atencin urgente o en una sala de emergencias.  Si tiene Engineering geologist, por favor llame inmediatamente al 911 o vaya a la sala de emergencias.  Nmeros de bper  - Dr. Nehemiah Massed: [phone]  - Dra. Moye:  [phone]  - Dra. Nicole Kindred: [phone]  En caso de inclemencias del [place], por favor llame a Johnsie Kindred principal al [phone] para una actualizacin sobre el [place] de cualquier retraso o cierre.  Consejos para la medicacin en dermatologa: Por favor, guarde las cajas en las que vienen los medicamentos de uso tpico para ayudarle a seguir las instrucciones sobre dnde y cmo usarlos. Las farmacias generalmente imprimen las instrucciones del medicamento slo en las cajas y no directamente en los tubos del [place].   Si su medicamento es muy caro, por favor, pngase en contacto con Zigmund Daniel llamando al [phone] y presione la opcin 4 o envenos un mensaje a travs de Pharmacist, community.   No podemos decirle cul ser su copago por los medicamentos por adelantado ya que esto es diferente dependiendo de la cobertura de su seguro. Sin embargo,  es posible que podamos encontrar un medicamento sustituto a Electrical engineer un formulario para que el seguro cubra el medicamento que se considera necesario.   Si se requiere una autorizacin previa para que su compaa de seguros [place] su medicamento, por favor permtanos de 1 a 2 das hbiles para completar este proceso.  Los precios de los medicamentos varan con frecuencia dependiendo del Environmental consultant de dnde se surte la receta y alguna farmacias pueden ofrecer precios ms baratos.  El sitio web www.goodrx.com tiene cupones para medicamentos de Airline pilot. Los precios aqu no tienen en cuenta lo que podra costar con la ayuda del seguro (puede ser ms barato con su seguro), pero el sitio web puede darle el precio si no utiliz Research scientist (physical sciences).  - Puede imprimir el cupn correspondiente y llevarlo con su receta a la farmacia.  - Tambin puede pasar por nuestra oficina durante el horario de atencin regular y Charity fundraiser una tarjeta de cupones de GoodRx.  - Si necesita que su receta se enve electrnicamente a una farmacia diferente,  informe a nuestra oficina a travs de MyChart de [place] o por telfono llamando al [phone] y presione la opcin 4.
